# Patient Record
Sex: Female | Born: 1945 | Race: White | Hispanic: No | Marital: Married | Smoking: Former smoker
Health system: Southern US, Community
[De-identification: ages and names within clinical notes are randomized; demographics above are authoritative.]

## PROBLEM LIST (undated history)

## (undated) DIAGNOSIS — E785 Hyperlipidemia, unspecified: Secondary | ICD-10-CM

## (undated) DIAGNOSIS — I8390 Asymptomatic varicose veins of unspecified lower extremity: Secondary | ICD-10-CM

## (undated) HISTORY — DX: Hyperlipidemia, unspecified: E78.5

## (undated) HISTORY — PX: ABDOMINAL HYSTERECTOMY: SHX81

## (undated) HISTORY — DX: Asymptomatic varicose veins of unspecified lower extremity: I83.90

---

## 1982-12-23 HISTORY — PX: VARICOSE VEIN SURGERY: SHX832

## 2002-10-07 ENCOUNTER — Ambulatory Visit (HOSPITAL_COMMUNITY): Admission: RE | Admit: 2002-10-07 | Discharge: 2002-10-07 | Payer: Self-pay | Admitting: Gastroenterology

## 2002-10-11 ENCOUNTER — Encounter: Admission: RE | Admit: 2002-10-11 | Discharge: 2002-10-11 | Payer: Self-pay | Admitting: Gastroenterology

## 2002-10-11 ENCOUNTER — Encounter: Payer: Self-pay | Admitting: Gastroenterology

## 2003-02-02 ENCOUNTER — Other Ambulatory Visit: Admission: RE | Admit: 2003-02-02 | Discharge: 2003-02-02 | Payer: Self-pay | Admitting: Obstetrics and Gynecology

## 2004-02-21 ENCOUNTER — Other Ambulatory Visit: Admission: RE | Admit: 2004-02-21 | Discharge: 2004-02-21 | Payer: Self-pay | Admitting: Obstetrics and Gynecology

## 2005-03-12 ENCOUNTER — Other Ambulatory Visit: Admission: RE | Admit: 2005-03-12 | Discharge: 2005-03-12 | Payer: Self-pay | Admitting: Obstetrics and Gynecology

## 2005-07-05 ENCOUNTER — Ambulatory Visit (HOSPITAL_COMMUNITY): Admission: RE | Admit: 2005-07-05 | Discharge: 2005-07-05 | Payer: Self-pay | Admitting: Family Medicine

## 2008-05-11 ENCOUNTER — Ambulatory Visit: Payer: Self-pay | Admitting: Vascular Surgery

## 2009-05-12 ENCOUNTER — Ambulatory Visit: Payer: Self-pay | Admitting: Vascular Surgery

## 2011-05-07 NOTE — Procedures (Signed)
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   HISTORY:  Diabetes:  No.  Cardiac:  No.  Hypertension:  No.   EXAM:  Duplex of popliteal arteries bilaterally.  Proximal right popliteal measured 0.66 cm AP x 0.80 cm transverse.  Proximal left popliteal artery measured 0.85 cm AP x 0.82 cm transverse.  Mid right popliteal artery measured 0.79 cm AP x 0.86 cm transverse.  Left mid popliteal artery measured 0.74 cm AP x 0.83 cm transverse.  Distal right popliteal artery measured 0.60 cm AP and 0.85 cm  transverse.  Distal left popliteal artery measured 0.51 cm AP and 0.66  cm transverse.   IMPRESSION:  No evidence of significant popliteal artery dilatation  bilaterally.   ___________________________________________  Janetta Hora Fields, MD   MC/MEDQ  D:  05/11/2008  T:  05/11/2008  Job:  [id number]

## 2011-05-07 NOTE — Assessment & Plan Note (Signed)
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  CT was performed for  evaluation of a left adrenal nodule.  She was also noted to have some  atherosclerosis of the abdominal aorta and proximal iliac vessels.   Her atherosclerotic risk factors include elevated cholesterol.  She has  no family history of aneurysm.  She has had no abdominal or back pain.   PAST MEDICAL HISTORY:  Is otherwise remarkable for hysterectomy,  cervical dysplasia and uterine cancer at age 61.  She has a history of  low back pain and degenerative disk disease.  She previously had a vein  stripping, rhinoplasty, and has arthritis of both knees, and history of  a solitary left adrenal nodule which has been asymptomatic.  She also  had a recent thyroid nodule found on ultrasound.   Medications include calcium, Excedrin p.r.n. 10 mg half a tablet once a  day, Flexeril 10 mg t.i.d. p.r.n. Flonase nasal spray.   She has no known drug allergies.   FAMILY HISTORY:  Unremarkable.   SOCIAL HISTORY:  She is married.  She has two children.  She is a former  smoker, quit 10 years ago.  She does not consume alcohol regularly.   REVIEW OF SYSTEMS:  She is 5 feet 6 inches, 135 pounds.  She has some intermittent constipation.  She also has some mild anxiety.  Cardiac, pulmonary, GI, renal vascular, neurologic, orthopedic, ENT  hematology review of systems are otherwise negative.   PHYSICAL EXAM:  Blood pressure 133/96 in the left arm, heart rate 101  regular.  HEENT is unremarkable.  Neck has 2+ carotid pulses without  bruit.  Chest is clear to auscultation.  Cardiac exam is regular rate  rhythm.  Abdomen is soft, nontender, nondistended with an easily  palpable aortic pulsation.  She has no masses.   She has 2+ radial and  femoral pulses bilaterally.  She has a 3+ right popliteal pulse.  She  has a 1+ left popliteal and 1+ dorsalis pedis pulse bilaterally.   She had bilateral ABIs performed today which were 1.11 on the left and  1.12 on the right.  She also had bilateral popliteal ultrasounds to rule  out aneurysm today and she had normal, popliteal arteries bilaterally.  I reviewed her CT scan report from [facility] dated April 21st, 2009.  Films were not available for review today.  The report states mild  ectasia of the distal abdominal aorta with aortoiliac atherosclerosis   This could be evidence of ectasia of the abdominal aorta.  I discussed  with her today that ectasia primarily means she has slightly enlarged  arteries with no focal areas of aneurysmal dilatation.  She does not  currently need any operative therapy to repair this.  I did explain to  her that this could become aneurysmal over time.  I believe the best  option for her is a repeat ultrasound exam of her abdominal aorta in six  months' time to make sure she has had no changes.  We will also obtain  the actual films for my review to make sure there are no  further  findings.  Otherwise she was reassured and will follow up with me in one  year's time.  If her aorta has not changed at that time we will probably  re-scan her again in 5 years.   Janetta Hora. Fields, MD  Electronically Signed   CEF/MEDQ  D:  05/12/2008  T:  05/12/2008  Job:  [id number]   cc:   Chales Salmon. Abigail Miyamoto, M.D.

## 2011-05-07 NOTE — Procedures (Signed)
DUPLEX ULTRASOUND OF ABDOMINAL AORTA   INDICATION:  Evaluation for abdominal aortic aneurysm.   HISTORY:  Diabetes:  No.  Cardiac:  No.  Hypertension:  No.  Smoking:  Quit.  Connective Tissue Disorder:  Family History:  No.  Previous Surgery:  No.   DUPLEX EXAM:         AP (cm)                   TRANSVERSE (cm)  Proximal             2.93 cm                   2.20 cm  Mid                  1.91 cm                   2.13 cm  Distal               1.30 cm                   1.25 cm  Right Iliac          0.81 cm                   1.05 cm  Left Iliac           1.03 cm                   0.87 cm   PREVIOUS:  Date:  AP:  TRANSVERSE:   IMPRESSION:  Duplex shows no evidence of abdominal aortic aneurysm.   ___________________________________________  Janetta Hora Fields, MD   AC/MEDQ  D:  05/12/2009  T:  05/12/2009  Job:  [id number]

## 2011-05-10 NOTE — Op Note (Signed)
   NAMEDARENDA, FIKE                         ACCOUNT NO.:  [id number]   MEDICAL RECORD NO.:  [id number]                   PATIENT TYPE:  AMB   LOCATION:  ENDO                                 FACILITY:  [facility]   PHYSICIAN:  Danise Edge, M.D.                DATE OF BIRTH:  April 21, 1946   DATE OF PROCEDURE:  10/07/2002  DATE OF DISCHARGE:                                 OPERATIVE REPORT   PROCEDURE:  Screening colonoscopy.   INDICATIONS:  The patient is a 65 year old female born 2046-05-21.  The patient  underwent a flexible proctosigmoidoscopy a few years ago and was told she  had colonic diverticulosis.  On rare occasions following a bowel movement,  she will spot fresh blood on the toilet tissue.   I discussed with the patient the complications associated with colonoscopy  and polypectomy, including a 15 per thousand risk of bleeding and four per  thousand risk of colon perforation requiring surgical repair.  The patient  has signed the operative permit.   ENDOSCOPIST:  Danise Edge, M.D.   PREMEDICATION:  Versed 10 mg, fentanyl 75 mcg.   ENDOSCOPE:  Olympus pediatric colonoscope.   DESCRIPTION OF PROCEDURE:  After obtaining informed consent, the patient was  placed in the left lateral decubitus position.  I administered intravenous  fentanyl and intravenous Versed to achieve conscious sedation for the  procedure.  The patient's blood pressure, oxygen saturation, and cardiac  rhythm were monitored throughout the procedure and documented in the medical  record.   Anal inspection was normal.  Digital rectal exam was normal.  The Olympus  pediatric video colonoscope was introduced into the rectum and advanced to  the cecum.  Colonic preparation for the exam today was excellent.   Rectum normal.   Sigmoid colon and descending colon:  Left colonic diverticulosis.   Splenic flexure normal.   Transverse colon normal.   Hepatic flexure normal.   Ascending colon  normal.   Cecum and ileocecal valve normal.    ASSESSMENT:  Left colonic diverticulosis; otherwise normal proctocolonoscopy  to the cecum.  There is no endoscopic evidence for the presence of  colorectal neoplasia.                                               Danise Edge, M.D.    MJ/MEDQ  D:  10/07/2002  T:  10/08/2002  Job:  [id number]   cc:   Al Decant. Janey Greaser, M.D.  [address]  [place]  [place] [address]  Fax: [phone]

## 2012-08-18 ENCOUNTER — Ambulatory Visit (HOSPITAL_COMMUNITY): Payer: Self-pay | Admitting: Psychiatry

## 2012-08-26 ENCOUNTER — Ambulatory Visit (HOSPITAL_COMMUNITY): Payer: 59 | Admitting: Psychiatry

## 2012-09-01 ENCOUNTER — Ambulatory Visit (HOSPITAL_COMMUNITY): Payer: Self-pay | Admitting: Licensed Clinical Social Worker

## 2012-09-02 ENCOUNTER — Ambulatory Visit (HOSPITAL_COMMUNITY): Payer: Self-pay | Admitting: Licensed Clinical Social Worker

## 2012-09-09 ENCOUNTER — Ambulatory Visit (INDEPENDENT_AMBULATORY_CARE_PROVIDER_SITE_OTHER): Payer: 59 | Admitting: Licensed Clinical Social Worker

## 2012-09-09 DIAGNOSIS — F4322 Adjustment disorder with anxiety: Secondary | ICD-10-CM

## 2012-09-09 DIAGNOSIS — F4321 Adjustment disorder with depressed mood: Secondary | ICD-10-CM
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
## 2012-09-14 ENCOUNTER — Encounter (HOSPITAL_COMMUNITY): Payer: Self-pay | Admitting: Licensed Clinical Social Worker

## 2012-09-17 ENCOUNTER — Ambulatory Visit (INDEPENDENT_AMBULATORY_CARE_PROVIDER_SITE_OTHER): Payer: 59 | Admitting: Licensed Clinical Social Worker

## 2012-09-17 DIAGNOSIS — F4321 Adjustment disorder with depressed mood: Secondary | ICD-10-CM

## 2012-09-17 NOTE — Progress Notes (Signed)
   THERAPIST PROGRESS NOTE  Session Time: 12:15 - 12:45  Participation Level: Active  Behavioral Response: NeatAlertEuthymic  Type of Therapy: Individual Therapy  Treatment Goals addressed: Anxiety  Interventions: Motivational Interviewing and Supportive  Summary: Emily Ortiz is a 66 y.o. female who presents with anxiety.   Emily Ortiz talked about her relationship with long standing friends.connected to a club she belonged to.  The club was the [employer] and she got involved because she was not working when she moved here so had no way of making friends through work.  She developed friends with Herbie Drape  And then there was Hester Mates and Roderic Scarce was also a friend.  She started to look more closely at these friendships after her sister died for she had been friends with them for years.  The Club has since fallen a part.  The examples of issues she has had show a lot of passive aggressive behavior and behavior that reminds one of Middle School.  She wondered if sht ws seeing the issues with these friends in the correct way. Agreed with her about the meaning of their behavior.  She needed to take care of herself and she could probably have a certain level of friendship with some of the women but she would be better off with not being concerned about others.  There were issues of boundaries and problems with healthy expression of feelings.  She felt the need to explore her feelings and perceptions of these relationships. She can see where she has healthy and unhealthy relationships.  Somehow the crisis of losing her sister opened her eyes to these problems and reasons for dissatisfaction with some of these relationships.  She feels that she has more grieving to do about her sister..  .   Suicidal/Homicidal: Nowithout intent/plan  Plan: Return again in 1 weeks.  Diagnosis: Axis I: Adjustment Disorder with Depressed Mood    Axis II: Deferred    Mikeisha Lemonds,JUDITH A,  LCSW 09/17/2012

## 2012-09-24 ENCOUNTER — Ambulatory Visit (HOSPITAL_COMMUNITY): Payer: Self-pay | Admitting: Licensed Clinical Social Worker

## 2013-02-03 ENCOUNTER — Other Ambulatory Visit: Payer: Self-pay | Admitting: Otolaryngology

## 2013-02-03 DIAGNOSIS — R1319 Other dysphagia: Secondary | ICD-10-CM

## 2013-02-15 ENCOUNTER — Ambulatory Visit
Admission: RE | Admit: 2013-02-15 | Discharge: 2013-02-15 | Disposition: A | Discharge status: Home / Self Care | Payer: Managed Care, Other (non HMO) | Source: Ambulatory Visit | Attending: Otolaryngology | Admitting: Otolaryngology

## 2013-02-17 ENCOUNTER — Other Ambulatory Visit (HOSPITAL_COMMUNITY): Payer: Self-pay | Admitting: Otolaryngology

## 2013-02-18 ENCOUNTER — Ambulatory Visit (HOSPITAL_COMMUNITY)
Admission: RE | Admit: 2013-02-18 | Discharge: 2013-02-18 | Disposition: A | Discharge status: Home / Self Care | Payer: Managed Care, Other (non HMO) | Source: Ambulatory Visit | Attending: Otolaryngology | Admitting: Otolaryngology

## 2013-02-18 DIAGNOSIS — R1310 Dysphagia, unspecified: Secondary | ICD-10-CM | POA: Insufficient documentation

## 2013-02-18 NOTE — Procedures (Cosign Needed)
Objective Swallowing Evaluation: Modified Barium Swallowing Study  Patient Details  Name: Emily Ortiz MRN: [id number] Date of Birth: 19-Jan-1946  Today's Date: 02/18/2013 Time: 4098-1191 SLP Time Calculation (min): 32 min  Past Medical History: No past medical history on file. Past Surgical History: No past surgical history on file. HPI:  Emily Ortiz is a 67 year old female with recent c/o difficulty swallowing, specifically food sticking in her throat and states the she can feel the food move down her throat slowly. She has put herself on a puree diet as regular food is difficult to swallow. A recent visit to ENT for an Esophagram revealed sluggish movement through the esophagus. Patient referred by ENT for MBS to objectively study patients swallow.     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 Patient with continued c/o globus through out evaluation despite full pharyngeal cleareance. SLP utilized biofeedback to re-assure patient that pharynx clear post swallow. Esophageal sweep did reveal sluggish esophageal clearance of bolus as noted on recent barium swallow study. SLP provided extensive education to patient and her husband on findings, suspicion for primary esophageal dysfunction to cause globus during pos,  as well as compensatory strategies to aid in esophageal clearance and attempt to eleviate symptoms. Recommend remaining uprigth after meals for a minimum of 30 mins, avoid very dry/tough solids, alternation of liquids and solids, and f/u with GI for further diagnosis or treatment for primary esophageal related deficits.     Treatment Recommendation       Diet Recommendation Regular;Thin liquid   Liquid Administration via: Cup;Straw Medication Administration:  Whole meds with liquid Supervision: Patient able to self feed Compensations: Slow rate;Small sips/bites;Follow solids with liquid Postural Changes and/or Swallow Maneuvers: Seated upright 90 degrees;Upright 30-60 min after meal    Other  Recommendations Recommended Consults: Consider GI evaluation Oral Care Recommendations: Oral care BID   Follow Up Recommendations  None    Frequency and Duration        Pertinent Vitals/Pain None reported    SLP Swallow Goals     General HPI: Emily Ortiz is a 67 year old female with recent c/o difficulty swallowing, specifically food sticking in her throat and states the she can feel the food move down her throat slowly. She has put herself on a puree diet as regular food is difficult to swallow. A recent visit to ENT for an Esophagram revealed sluggish movement through the esophagus. Patient referred by ENT for MBS to objectively study patients swallow. Type of Study: Modified Barium Swallowing Study Reason for Referral: Objectively evaluate swallowing function Previous Swallow Assessment: Barium swallow at [facility] 02/15/13 Diet Prior to this Study: Dysphagia 1 (puree);Thin liquids (self-imposed diet) Respiratory Status: Room air History of Recent Intubation: No Behavior/Cognition: Alert;Cooperative;Pleasant mood Oral Cavity - Dentition: Adequate natural dentition Oral Motor / Sensory Function: Within functional limits Self-Feeding Abilities: Able to feed self Patient Positioning: Upright in chair Baseline Vocal Quality: Clear Volitional Cough: Strong Volitional Swallow: Able to elicit Anatomy: Within functional limits Pharyngeal Secretions: Not observed secondary MBS    Reason for Referral Objectively evaluate swallowing function   Oral Phase Oral Preparation/Oral Phase Oral Phase: WFL   Pharyngeal Phase Pharyngeal Phase Pharyngeal Phase: Within functional limits  Cervical Esophageal Phase    GO    Cervical  Esophageal Phase Cervical Esophageal Phase: [facility]    Functional  Assessment Tool Used: skilled clinical judgement Functional Limitations: Swallowing Swallow Current Status (Z6109): At least 1 percent but less than 20 percent impaired, limited or restricted Swallow Goal Status [phone]): At least 1 percent but less than 20 percent impaired, limited or restricted Swallow Discharge Status [phone]): At least 1 percent but less than 20 percent impaired, limited or restricted   Berdine Dance SLP student Robynne, [place] 02/18/2013, 1:28 PM

## 2013-07-28 IMAGING — RF DG ESOPHAGUS
16 of 18 series · 20 of 24 positions shown · non-contrast
Comparison: None.

CLINICAL DATA: Marked difficulty swallowing food.

ESOPHOGRAM/BARIUM SWALLOW
TECHNIQUE: Combined double contrast and single contrast
examination performed using effervescent crystals, thick barium
liquid, and thin barium liquid.
Fluoroscopy time:  1.0 minutes.

[Series 1: run · 4 of 9 slices shown (1 of 16)]
[im 1/9]
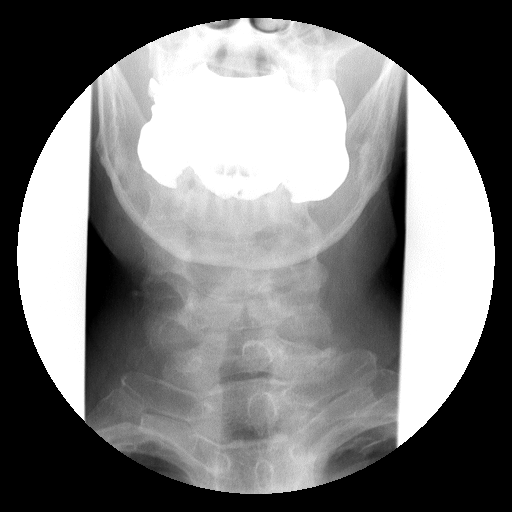
[im 3/9]
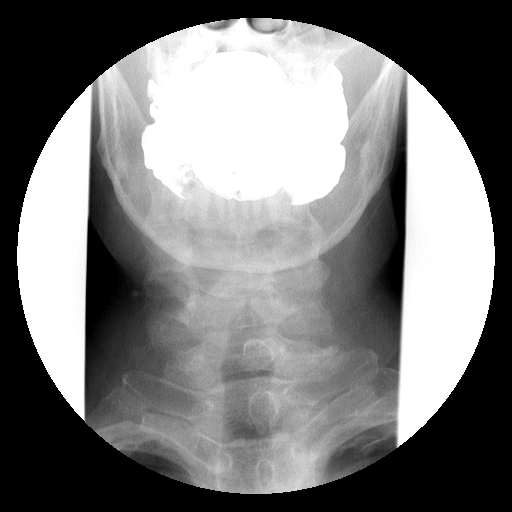
[im 7/9]
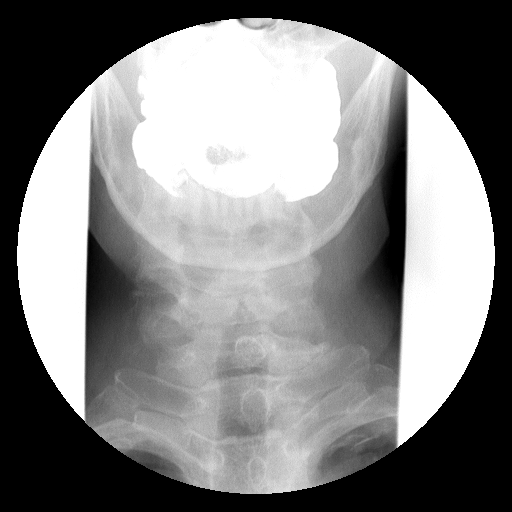
[im 9/9]
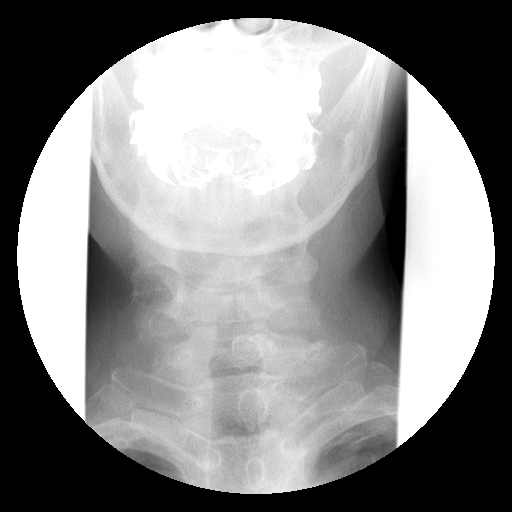

[Series 2: run · 1 of 3 slices shown (2 of 16)]
[im 1/3]
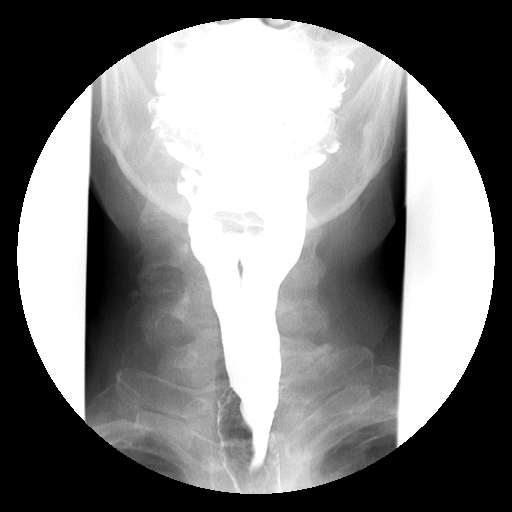

[Series 3: run · 1 of 1 slices shown (3 of 16)]
[im 1/1]
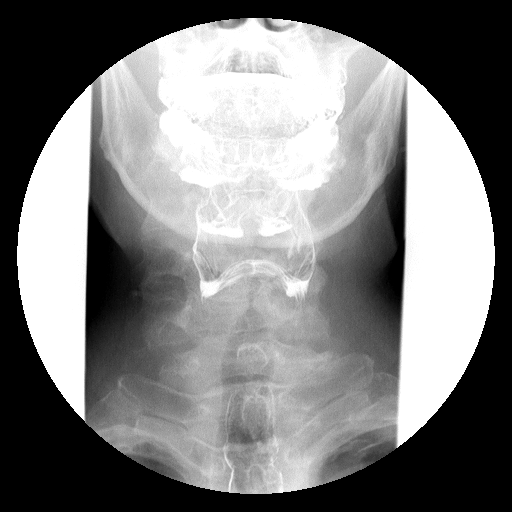

[Series 4: run · 2 of 6 slices shown (4 of 16)]
[im 1/6]
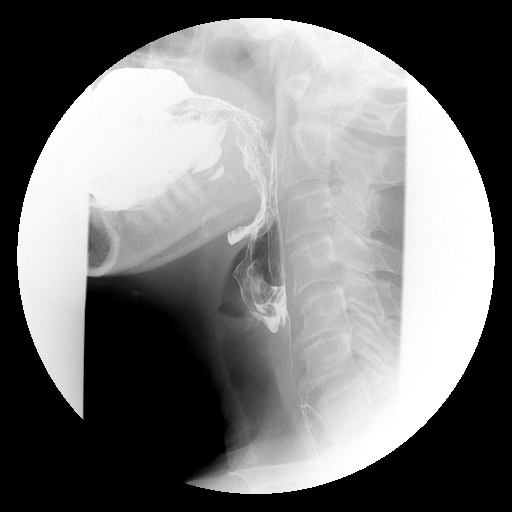
[im 6/6]
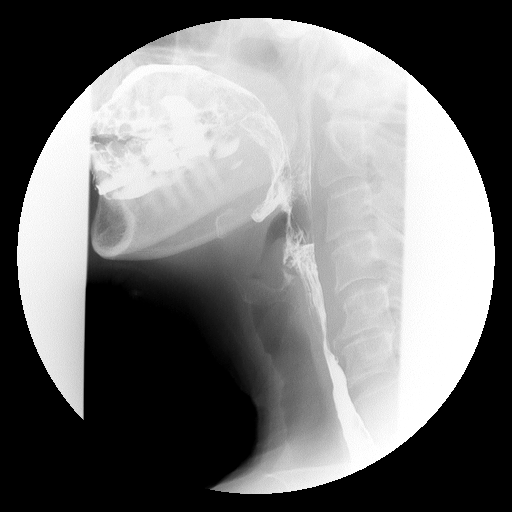

[Series 5: run · 1 of 1 slices shown (5 of 16)]
[im 1/1]
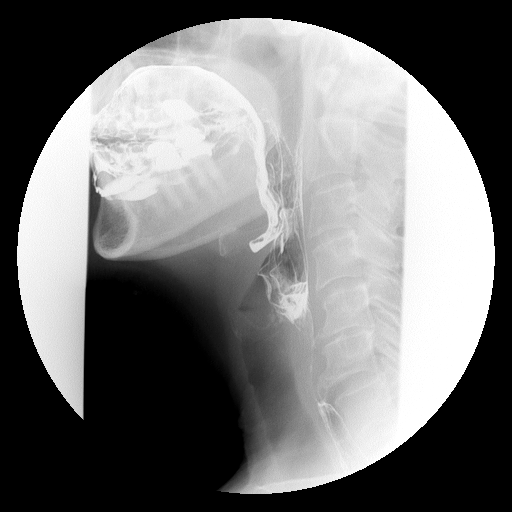

[Series 6: run · 1 of 1 slices shown (6 of 16)]
[im 1/1]
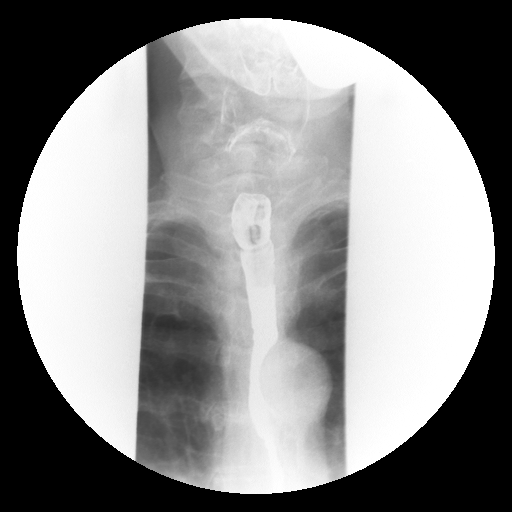

[Series 7: run · 1 of 1 slices shown (7 of 16)]
[im 1/1]
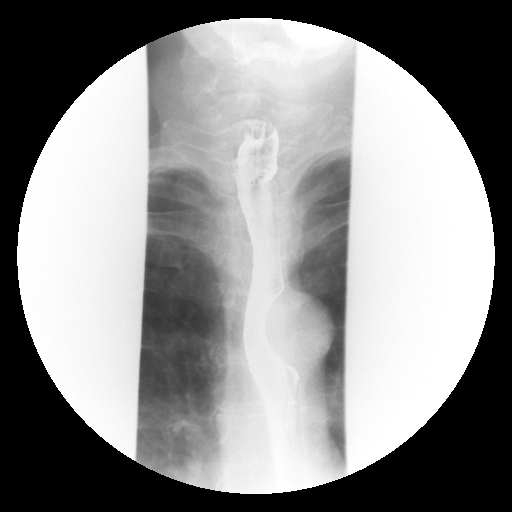

[Series 8: run · 1 of 1 slices shown (8 of 16)]
[im 1/1]
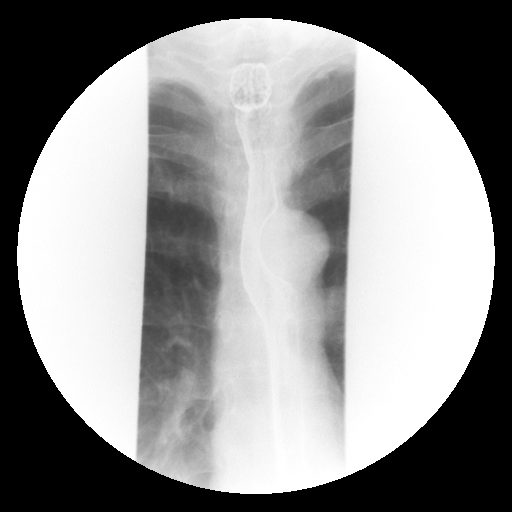

[Series 10: run · 1 of 1 slices shown (9 of 16)]
[im 1/1]
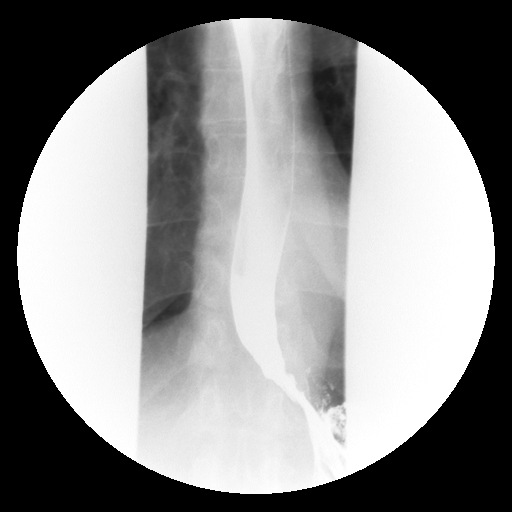

[Series 11: run · 1 of 1 slices shown (10 of 16)]
[im 1/1]
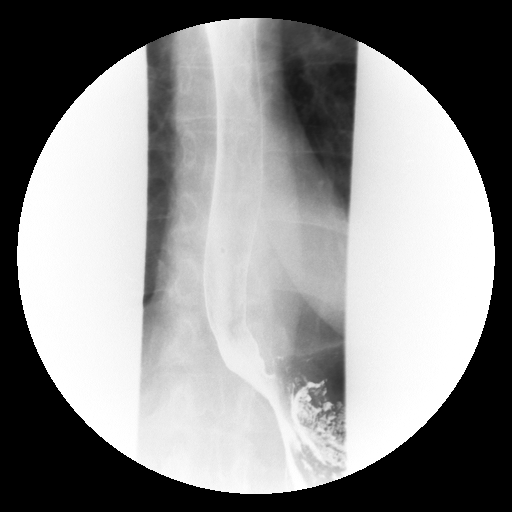

[Series 12: run · 1 of 1 slices shown (11 of 16)]
[im 1/1]
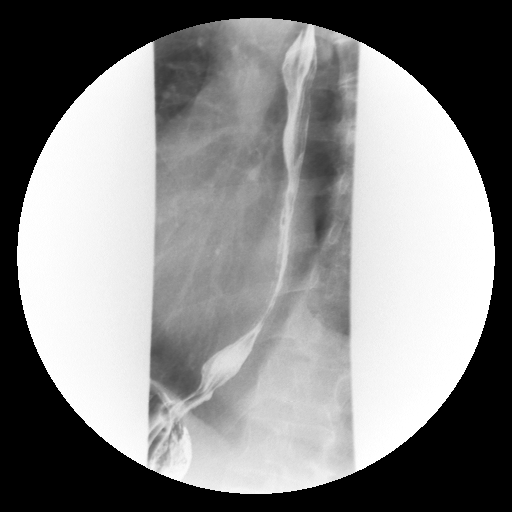

[Series 13: run · 1 of 1 slices shown (12 of 16)]
[im 1/1]
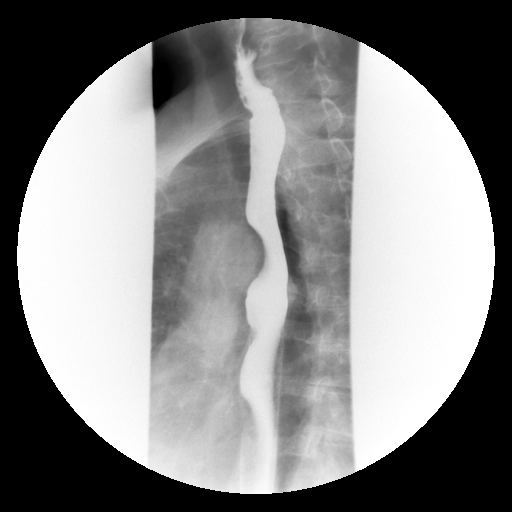

[Series 14: run · 1 of 1 slices shown (13 of 16)]
[im 1/1]
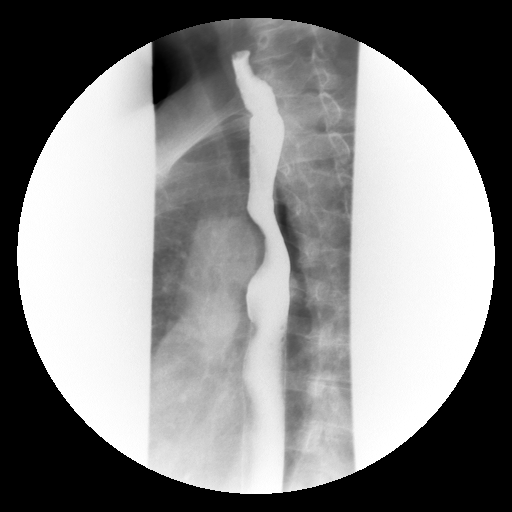

[Series 16: run · 1 of 1 slices shown (14 of 16)]
[im 1/1]
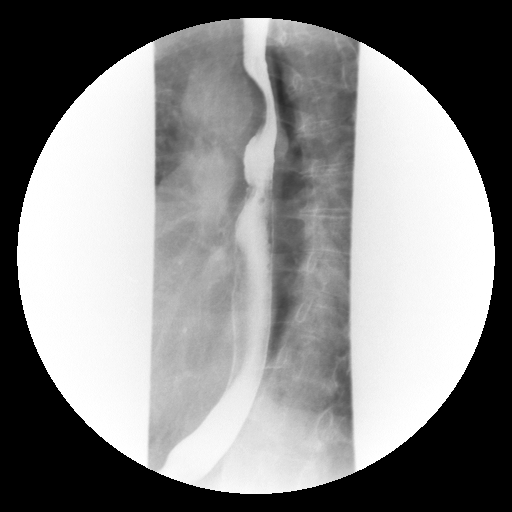

[Series 17: run · 1 of 1 slices shown (15 of 16)]
[im 1/1]
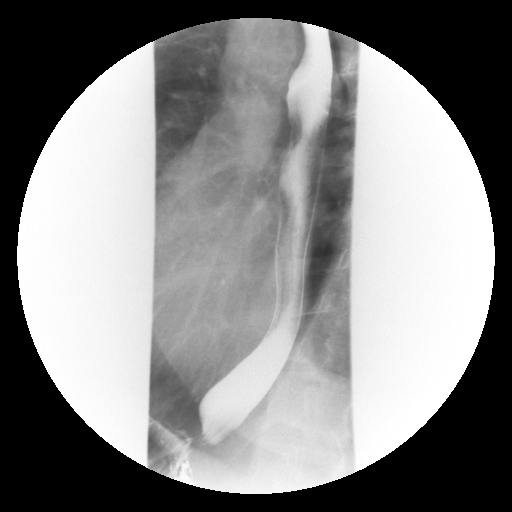

[Series 18: run · 1 of 1 slices shown (16 of 16)]
[im 1/1]
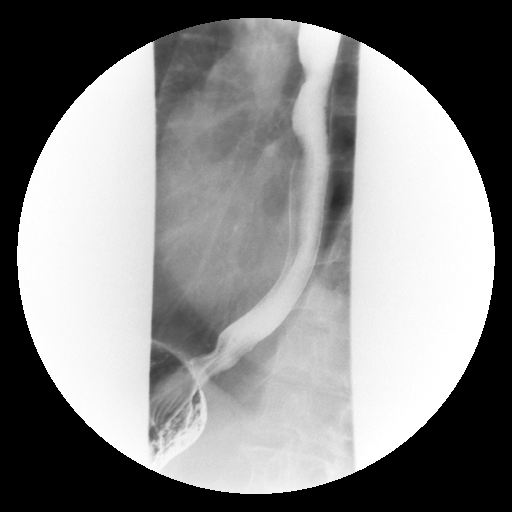

[20 of 24 positions shown; findings below may reference images not displayed]

FINDINGS: Swallowing mechanism is normal.  Esophageal motility is
occasionally sluggish.  No esophageal fold thickening, stricture or
obstruction.  A 13 mm barium pill passed into the stomach without
difficulty.
IMPRESSION: Occasionally sluggish esophageal motility.  Otherwise negative.

## 2014-04-25 ENCOUNTER — Other Ambulatory Visit: Payer: Self-pay | Admitting: *Deleted

## 2014-04-25 DIAGNOSIS — I77811 Abdominal aortic ectasia: Secondary | ICD-10-CM

## 2014-05-02 ENCOUNTER — Ambulatory Visit: Payer: Self-pay | Admitting: Podiatry

## 2014-05-04 ENCOUNTER — Encounter: Payer: Self-pay | Admitting: Family

## 2014-05-05 ENCOUNTER — Ambulatory Visit (HOSPITAL_COMMUNITY)
Admission: RE | Admit: 2014-05-05 | Discharge: 2014-05-05 | Disposition: A | Discharge status: Home / Self Care | Payer: Managed Care, Other (non HMO) | Source: Ambulatory Visit | Attending: Family | Admitting: Family

## 2014-05-05 ENCOUNTER — Encounter: Payer: Self-pay | Admitting: Family

## 2014-05-05 ENCOUNTER — Ambulatory Visit (INDEPENDENT_AMBULATORY_CARE_PROVIDER_SITE_OTHER): Payer: Managed Care, Other (non HMO) | Admitting: Family

## 2014-05-05 ENCOUNTER — Encounter (INDEPENDENT_AMBULATORY_CARE_PROVIDER_SITE_OTHER): Payer: Self-pay

## 2014-05-05 VITALS — BP 147/103 | HR 72 | Resp 16 | Ht 66.0 in | Wt 130.0 lb

## 2014-05-05 DIAGNOSIS — I77811 Abdominal aortic ectasia: Secondary | ICD-10-CM

## 2014-05-05 DIAGNOSIS — I714 Abdominal aortic aneurysm, without rupture, unspecified: Secondary | ICD-10-CM | POA: Insufficient documentation

## 2014-05-05 NOTE — Progress Notes (Signed)
[facility]  Established Abdominal Aortic Aneurysm  History of Present Illness  Emily Ortiz is a 68 y.o. (1946/01/28) female patient of Dr. Darrick PennaFields who presents with chief complaint: follow up for AAA.  Small AAA was an incidental finding on a CT as part of an evaluation of mid abdominal pain. Patient states she has IBS and has abdominal pain with fiber intake. She denies back pain, denies claudication symptoms with walking, denies stroke or TIA history.  Previous studies demonstrate an AAA, measuring 2.93 cm on Duplex in 2010.   Patient reports that she exercises regularly.   Pt Diabetic: No Pt smoker: former smoker, quit 15 years ago  Past Medical History  Diagnosis Date  . Varicose veins    History reviewed. No pertinent past surgical history. Social History History   Social History  . Marital Status: Married    Spouse Name: N/A    Number of Children: N/A  . Years of Education: N/A   Occupational History  . Not on file.   Social History Main Topics  . Smoking status: Former Smoker    Quit date: 05/06/1999  . Smokeless tobacco: Never Used  . Alcohol Use: Yes     Comment: Infrequent  . Drug Use: No  . Sexual Activity: Yes   Other Topics Concern  . Not on file   Social History Narrative  . No narrative on file   Family History Family History  Problem Relation Age of Onset  . Alcohol abuse Sister   . Varicose Veins Sister     Current Outpatient Prescriptions on File Prior to Visit  Medication Sig Dispense Refill  . rosuvastatin (CRESTOR) 10 MG tablet Take 10 mg by mouth daily.       No current facility-administered medications on file prior to visit.   No Known Allergies  ROS: See HPI for pertinent positives and negatives.  Physical Examination  Filed Vitals:   05/05/14 0912  BP: 147/103  Pulse: 72  Resp: 16  Height: 5\' 6"  (1.676 m)  Weight: 130 lb (58.968 kg)  SpO2: 100%   Body mass index is 20.99  kg/(m^2).  General: A&O x 3, WD.  Pulmonary: Sym exp, good air movt, CTAB, no rales, rhonchi, or wheezing.  Cardiac: RRR, Nl S1, S2, no detected murmur.   Carotid Bruits Left Right   Negative Negative   Radial pulses are 2+ palpable and =                          VASCULAR EXAM:                                                                                                         LE Pulses LEFT RIGHT       POPLITEAL  not palpable   not palpable       POSTERIOR TIBIAL   palpable    palpable        DORSALIS PEDIS      ANTERIOR TIBIAL not palpable  not palpable      Gastrointestinal: soft, NTND, -G/R, - HSM, - masses, - CVAT B.  Musculoskeletal: M/S 5/5 throughout, Extremities without ischemic changes.  Neurologic: CN 2-12 intact, Pain and light touch intact in extremities are intact, Motor exam as listed above.  Non-Invasive Vascular Imaging  AAA Duplex (05/05/2014)  Previous size: 2.93 cm (Date: 05/12/2009)  Current size:  2.98 cm (Date: 05/05/2014)  ABDOMINAL AORTA DUPLEX EVALUATION    INDICATION: Evaluation of abdominal aorta.    PREVIOUS INTERVENTION(S):     DUPLEX EXAM:     LOCATION DIAMETER AP (cm) DIAMETER TRANSVERSE (cm) VELOCITIES (cm/sec)  Aorta Proximal 2.91 2.98 60  Aorta Mid 2.04 2.04 37  Aorta Distal 1.75 1.66 37  Right Common Iliac Artery 0.59 0.71 90  Left Common Iliac Artery 0.56  115    Previous max aortic diameter:  2.93 x 2.20 Date: 05/12/2009     ADDITIONAL FINDINGS:     IMPRESSION: Patent abdominal aorta with a maximum diameter of 2.91 x 2.98 cm in the proximal segment.    Compared to the previous exam:  No significant change in comparison to the last exam on 05/12/2009.     Medical Decision Making  The patient is a 68 y.o. female who presents with asymptomatic normal size aorta, no significant change compared to last exam on 05/12/2009.   Based on this patient's exam and diagnostic studies, and after discussing with Dr. Darrick PennaFields,  the patient will follow up as needed.  Her PCP may consider checking her abdominal aorta in about 5 years with a Duplex US.  Consideration for repair of AAA would be made when the size is 5.5 cm, growth > 1 cm/yr, and symptomatic status.  I emphasized the importance of maximal medical management including strict control of blood pressure, blood glucose, and lipid levels, antiplatelet agents, obtaining regular exercise, and continued cessation of smoking.   The patient was given information about AAA including signs, symptoms, treatment, and how to minimize the risk of enlargement and rupture of aneurysms.    The patient was advised to call 911 should the patient experience sudden onset abdominal or back pain.   Thank you for allowing us to participate in this patient's care.  Charisse MarchSuzanne Alois Colgan, RN, MSN, FNP-C [facility] [place] Office: [phone]  Clinic Physician: Darrick PennaFields  05/05/2014, 9:14 AM

## 2014-05-05 NOTE — Patient Instructions (Signed)

## 2014-05-09 ENCOUNTER — Encounter: Payer: Self-pay | Admitting: Podiatry

## 2014-05-09 ENCOUNTER — Ambulatory Visit: Payer: Self-pay | Admitting: Podiatry

## 2014-05-09 ENCOUNTER — Ambulatory Visit (INDEPENDENT_AMBULATORY_CARE_PROVIDER_SITE_OTHER): Payer: Managed Care, Other (non HMO) | Admitting: Podiatry

## 2014-05-09 VITALS — Resp 13 | Ht 66.0 in | Wt 130.0 lb

## 2014-05-09 DIAGNOSIS — L600 Ingrowing nail: Secondary | ICD-10-CM

## 2014-05-09 DIAGNOSIS — L84 Corns and callosities: Secondary | ICD-10-CM

## 2014-05-09 NOTE — Progress Notes (Signed)
   Subjective:    Patient ID: Emily BakerJessica N Sevin, female    DOB: May 22, 1946, [age] y.o.   MRN: [id number]  HPI Comments: Pt has hard cored areas over multiple areas of her feet and most prevalent around the heels, for over 2 years.  Pt states heels turn blue periodically, without aggravating circumstance.  Pt complains of painful ingrown toenail Right 1st toenail both borders, the nail is encurvated, and she has been pulling sides apart and applying Neosporin.     Review of Systems  HENT: Positive for tinnitus.   All other systems reviewed and are negative.      Objective:   Physical Exam        Assessment & Plan:

## 2014-05-09 NOTE — Progress Notes (Signed)
Subjective:     Patient ID: Emily BakerJessica N Delsanto, female   DOB: 18-Jun-1946, 68 y.o.   MRN: [id number]  HPI patient states to have these lesions on the bottom of both my heels and I have an ingrown toenail right big toe that I Need to get corrected. Has been going on a number of year   Review of Systems  All other systems reviewed and are negative.      Objective:   Physical Exam  Nursing note and vitals reviewed. Constitutional: She is oriented to person, place, and time.  Cardiovascular: Intact distal pulses.   Musculoskeletal: Normal range of motion.  Neurological: She is oriented to person, place, and time.  Skin: Skin is warm.   neurovascular status found to be intact muscle strength adequate with and incurvated big toenail right hallux medial and lateral borders in numerous keratotic lesions plantar aspect of both feet with centers that are waxy     Assessment:     Ingrown toenail right hallux and porokeratosis lesions    Plan:     H&P reviewed conditions discussed and on the right big toe. She then decided not to do the procedure because she is going out of town and today deep debridement of all lesions was accomplished with no iatrogenic bleeding noted and she will be seen back after her trip for removal of the ingrown toenail corner right hallux lateral border

## 2014-05-23 ENCOUNTER — Ambulatory Visit: Payer: Managed Care, Other (non HMO) | Admitting: Podiatry

## 2014-05-23 ENCOUNTER — Ambulatory Visit: Payer: Self-pay | Admitting: Podiatry

## 2014-11-07 ENCOUNTER — Ambulatory Visit: Payer: Managed Care, Other (non HMO) | Admitting: Podiatry

## 2015-11-10 ENCOUNTER — Other Ambulatory Visit: Payer: Self-pay | Admitting: Obstetrics and Gynecology

## 2015-11-10 DIAGNOSIS — N644 Mastodynia: Secondary | ICD-10-CM

## 2015-11-24 ENCOUNTER — Other Ambulatory Visit: Payer: Self-pay
